# Patient Record
Sex: Male | Born: 1970 | Hispanic: No | Smoking: Never smoker
Health system: Southern US, Community
[De-identification: ages and names within clinical notes are randomized; demographics above are authoritative.]

---

## 1999-08-14 ENCOUNTER — Other Ambulatory Visit: Admission: RE | Admit: 1999-08-14 | Discharge: 1999-08-14 | Payer: Self-pay | Admitting: Otolaryngology

## 1999-08-14 ENCOUNTER — Encounter (INDEPENDENT_AMBULATORY_CARE_PROVIDER_SITE_OTHER): Payer: Self-pay | Admitting: Specialist

## 2006-12-10 ENCOUNTER — Ambulatory Visit: Payer: Self-pay | Admitting: Internal Medicine

## 2006-12-14 ENCOUNTER — Ambulatory Visit: Payer: Self-pay | Admitting: *Deleted

## 2007-03-08 ENCOUNTER — Ambulatory Visit: Payer: Self-pay | Admitting: Internal Medicine

## 2007-03-08 ENCOUNTER — Ambulatory Visit (HOSPITAL_COMMUNITY): Admission: RE | Admit: 2007-03-08 | Discharge: 2007-03-08 | Payer: Self-pay | Admitting: Internal Medicine

## 2007-09-23 ENCOUNTER — Ambulatory Visit: Payer: Self-pay | Admitting: Internal Medicine

## 2008-03-27 ENCOUNTER — Ambulatory Visit: Payer: Self-pay | Admitting: Internal Medicine

## 2008-04-11 ENCOUNTER — Ambulatory Visit: Payer: Self-pay | Admitting: Internal Medicine

## 2008-04-11 ENCOUNTER — Encounter (INDEPENDENT_AMBULATORY_CARE_PROVIDER_SITE_OTHER): Payer: Self-pay | Admitting: Adult Health

## 2008-04-11 LAB — CONVERTED CEMR LAB
ALT: 39 units/L (ref 0–53)
Albumin: 4.8 g/dL (ref 3.5–5.2)
Alkaline Phosphatase: 58 units/L (ref 39–117)
BUN: 12 mg/dL (ref 6–23)
Basophils Absolute: 0 10*3/uL (ref 0.0–0.1)
Hemoglobin: 14.8 g/dL (ref 13.0–17.0)
MCHC: 34 g/dL (ref 30.0–36.0)
MCV: 82.2 fL (ref 78.0–100.0)
Monocytes Absolute: 0.4 10*3/uL (ref 0.1–1.0)
PSA: 0.98 ng/mL (ref 0.10–4.00)
Potassium: 3.9 meq/L (ref 3.5–5.3)
RBC: 5.29 M/uL (ref 4.22–5.81)
RDW: 13.5 % (ref 11.5–15.5)
TSH: 3.096 microintl units/mL (ref 0.350–4.50)
Total Bilirubin: 1.1 mg/dL (ref 0.3–1.2)
Total CHOL/HDL Ratio: 6.4
VLDL: 64 mg/dL — ABNORMAL HIGH (ref 0–40)
WBC: 5.9 10*3/uL (ref 4.0–10.5)

## 2008-04-26 ENCOUNTER — Ambulatory Visit: Payer: Self-pay | Admitting: Internal Medicine

## 2009-01-13 ENCOUNTER — Inpatient Hospital Stay (HOSPITAL_COMMUNITY): Admission: EM | Admit: 2009-01-13 | Discharge: 2009-01-16 | Payer: Self-pay | Admitting: Emergency Medicine

## 2009-01-13 DIAGNOSIS — G459 Transient cerebral ischemic attack, unspecified: Secondary | ICD-10-CM | POA: Insufficient documentation

## 2009-01-15 ENCOUNTER — Encounter (INDEPENDENT_AMBULATORY_CARE_PROVIDER_SITE_OTHER): Payer: Self-pay | Admitting: Neurology

## 2009-01-15 ENCOUNTER — Ambulatory Visit: Payer: Self-pay | Admitting: Physical Medicine & Rehabilitation

## 2009-01-18 ENCOUNTER — Encounter: Admission: RE | Admit: 2009-01-18 | Discharge: 2009-01-19 | Payer: Self-pay | Admitting: Neurology

## 2009-03-05 ENCOUNTER — Ambulatory Visit: Payer: Self-pay | Admitting: Internal Medicine

## 2009-03-05 ENCOUNTER — Encounter (INDEPENDENT_AMBULATORY_CARE_PROVIDER_SITE_OTHER): Payer: Self-pay | Admitting: Adult Health

## 2009-03-05 LAB — CONVERTED CEMR LAB
AST: 34 units/L (ref 0–37)
Albumin: 4.4 g/dL (ref 3.5–5.2)
Alkaline Phosphatase: 60 units/L (ref 39–117)
BUN: 16 mg/dL (ref 6–23)
Calcium: 8.9 mg/dL (ref 8.4–10.5)
Chloride: 104 meq/L (ref 96–112)
Creatinine, Ser: 0.8 mg/dL (ref 0.40–1.50)
Glucose, Bld: 96 mg/dL (ref 70–99)
Potassium: 4.2 meq/L (ref 3.5–5.3)
Total Bilirubin: 0.8 mg/dL (ref 0.3–1.2)
Triglycerides: 220 mg/dL — ABNORMAL HIGH (ref <150)

## 2009-11-13 ENCOUNTER — Emergency Department (HOSPITAL_COMMUNITY): Admission: EM | Admit: 2009-11-13 | Discharge: 2009-11-13 | Payer: Self-pay | Admitting: Emergency Medicine

## 2010-09-29 LAB — BETA-2-GLYCOPROTEIN I ABS, IGG/M/A
Beta-2 Glyco I IgG: <10 U/mL (ref <20)
Beta-2-Glycoprotein I IgA: <10 U/mL (ref <20)
Beta-2-Glycoprotein I IgM: <10 U/mL (ref <20)

## 2010-09-29 LAB — DIFFERENTIAL
Basophils Absolute: 0 10*3/uL (ref 0.0–0.1)
Basophils Relative: 0 % (ref 0–1)
Basophils Relative: 0 % (ref 0–1)
Eosinophils Absolute: 0.1 10*3/uL (ref 0.0–0.7)
Lymphocytes Relative: 21 % (ref 12–46)
Lymphs Abs: 2.8 10*3/uL (ref 0.7–4.0)
Monocytes Relative: 12 % (ref 3–12)
Neutro Abs: 4.7 10*3/uL (ref 1.7–7.7)
Neutro Abs: 6.5 10*3/uL (ref 1.7–7.7)
Neutrophils Relative %: 55 % (ref 43–77)
Neutrophils Relative %: 70 % (ref 43–77)

## 2010-09-29 LAB — COMPREHENSIVE METABOLIC PANEL
ALT: 37 U/L (ref 0–53)
Alkaline Phosphatase: 59 U/L (ref 39–117)
Chloride: 99 mEq/L (ref 96–112)
GFR calc Af Amer: >60 mL/min (ref >60)
GFR calc non Af Amer: >60 mL/min (ref >60)
Sodium: 138 mEq/L (ref 135–145)
Total Protein: 8.3 g/dL (ref 6.0–8.3)

## 2010-09-29 LAB — CK TOTAL AND CKMB (NOT AT ARMC)
CK, MB: 4.8 ng/mL — ABNORMAL HIGH (ref 0.3–4.0)
CK, MB: 5.7 ng/mL — ABNORMAL HIGH (ref 0.3–4.0)
Relative Index: 2.3 (ref 0.0–2.5)
Total CK: 253 U/L — ABNORMAL HIGH (ref 7–232)

## 2010-09-29 LAB — BASIC METABOLIC PANEL
CO2: 27 mEq/L (ref 19–32)
Calcium: 9.1 mg/dL (ref 8.4–10.5)
Creatinine, Ser: 0.77 mg/dL (ref 0.4–1.5)
GFR calc non Af Amer: >60 mL/min (ref >60)
Glucose, Bld: 113 mg/dL — ABNORMAL HIGH (ref 70–99)
Sodium: 138 mEq/L (ref 135–145)

## 2010-09-29 LAB — URINALYSIS, MICROSCOPIC ONLY
Bilirubin Urine: NEGATIVE
Ketones, ur: NEGATIVE mg/dL
Nitrite: NEGATIVE
Specific Gravity, Urine: >1.046 — ABNORMAL HIGH (ref 1.005–1.030)
Urobilinogen, UA: 0.2 mg/dL (ref 0.0–1.0)
pH: 5.5 (ref 5.0–8.0)

## 2010-09-29 LAB — CARDIOLIPIN ANTIBODIES, IGG, IGM, IGA
Anticardiolipin IgA: <10 [APL'U] — ABNORMAL LOW (ref <12)
Anticardiolipin IgG: <10 [GPL'U] — ABNORMAL LOW (ref <15)
Anticardiolipin IgM: <10 [MPL'U] — ABNORMAL LOW (ref <13)

## 2010-09-29 LAB — CARDIAC PANEL(CRET KIN+CKTOT+MB+TROPI)
Total CK: 229 U/L (ref 7–232)
Troponin I: 0.02 ng/mL (ref 0.00–0.06)

## 2010-09-29 LAB — CBC
HCT: 45.2 % (ref 39.0–52.0)
Hemoglobin: 14.4 g/dL (ref 13.0–17.0)
MCHC: 34.5 g/dL (ref 30.0–36.0)
MCV: 83 fL (ref 78.0–100.0)
Platelets: 225 10*3/uL (ref 150–400)
RBC: 5.07 MIL/uL (ref 4.22–5.81)
RDW: 13.2 % (ref 11.5–15.5)
RDW: 13.7 % (ref 11.5–15.5)
WBC: 9.3 10*3/uL (ref 4.0–10.5)

## 2010-09-29 LAB — APTT: aPTT: 27 seconds (ref 24–37)

## 2010-09-29 LAB — LIPID PANEL
Cholesterol: 285 mg/dL — ABNORMAL HIGH (ref 0–200)
LDL Cholesterol: 196 mg/dL — ABNORMAL HIGH (ref 0–99)
Total CHOL/HDL Ratio: 7 RATIO
Triglycerides: 238 mg/dL — ABNORMAL HIGH (ref <150)
VLDL: 48 mg/dL — ABNORMAL HIGH (ref 0–40)

## 2010-09-29 LAB — ANTITHROMBIN III: AntiThromb III Func: 103 % (ref 76–126)

## 2010-09-29 LAB — ANA: Anti Nuclear Antibody(ANA): NEGATIVE

## 2010-09-29 LAB — GLUCOSE, CAPILLARY
Glucose-Capillary: 101 mg/dL — ABNORMAL HIGH (ref 70–99)
Glucose-Capillary: 113 mg/dL — ABNORMAL HIGH (ref 70–99)
Glucose-Capillary: 97 mg/dL (ref 70–99)

## 2010-09-29 LAB — TROPONIN I
Troponin I: 0.01 ng/mL (ref 0.00–0.06)
Troponin I: 0.02 ng/mL (ref 0.00–0.06)

## 2010-09-29 LAB — PROTEIN S, TOTAL: Protein S Ag, Total: 156 % — ABNORMAL HIGH (ref 70–140)

## 2010-09-29 LAB — HEMOGLOBIN A1C

## 2010-09-29 LAB — LUPUS ANTICOAGULANT PANEL
DRVVT: 62 secs — ABNORMAL HIGH (ref 36.1–47.0)
Lupus Anticoagulant: NOT DETECTED
PTT Lupus Anticoagulant: 62.4 secs — ABNORMAL HIGH (ref 36.3–48.8)

## 2010-09-29 LAB — PROTEIN C ACTIVITY: Protein C Activity: 55 % — ABNORMAL LOW (ref 75–133)

## 2010-09-29 LAB — HOMOCYSTEINE: Homocysteine: 9.5 umol/L (ref 4.0–15.4)

## 2010-09-29 LAB — PROTEIN S ACTIVITY: Protein S Activity: 127 % (ref 69–129)

## 2010-10-05 IMAGING — CR DG CHEST 1V PORT
1 series · 1 of 1 positions shown · non-contrast
Comparison: None.

CLINICAL DATA: 38-year-old male with weakness on the right.

PORTABLE CHEST - 1 VIEW

[view not recorded]
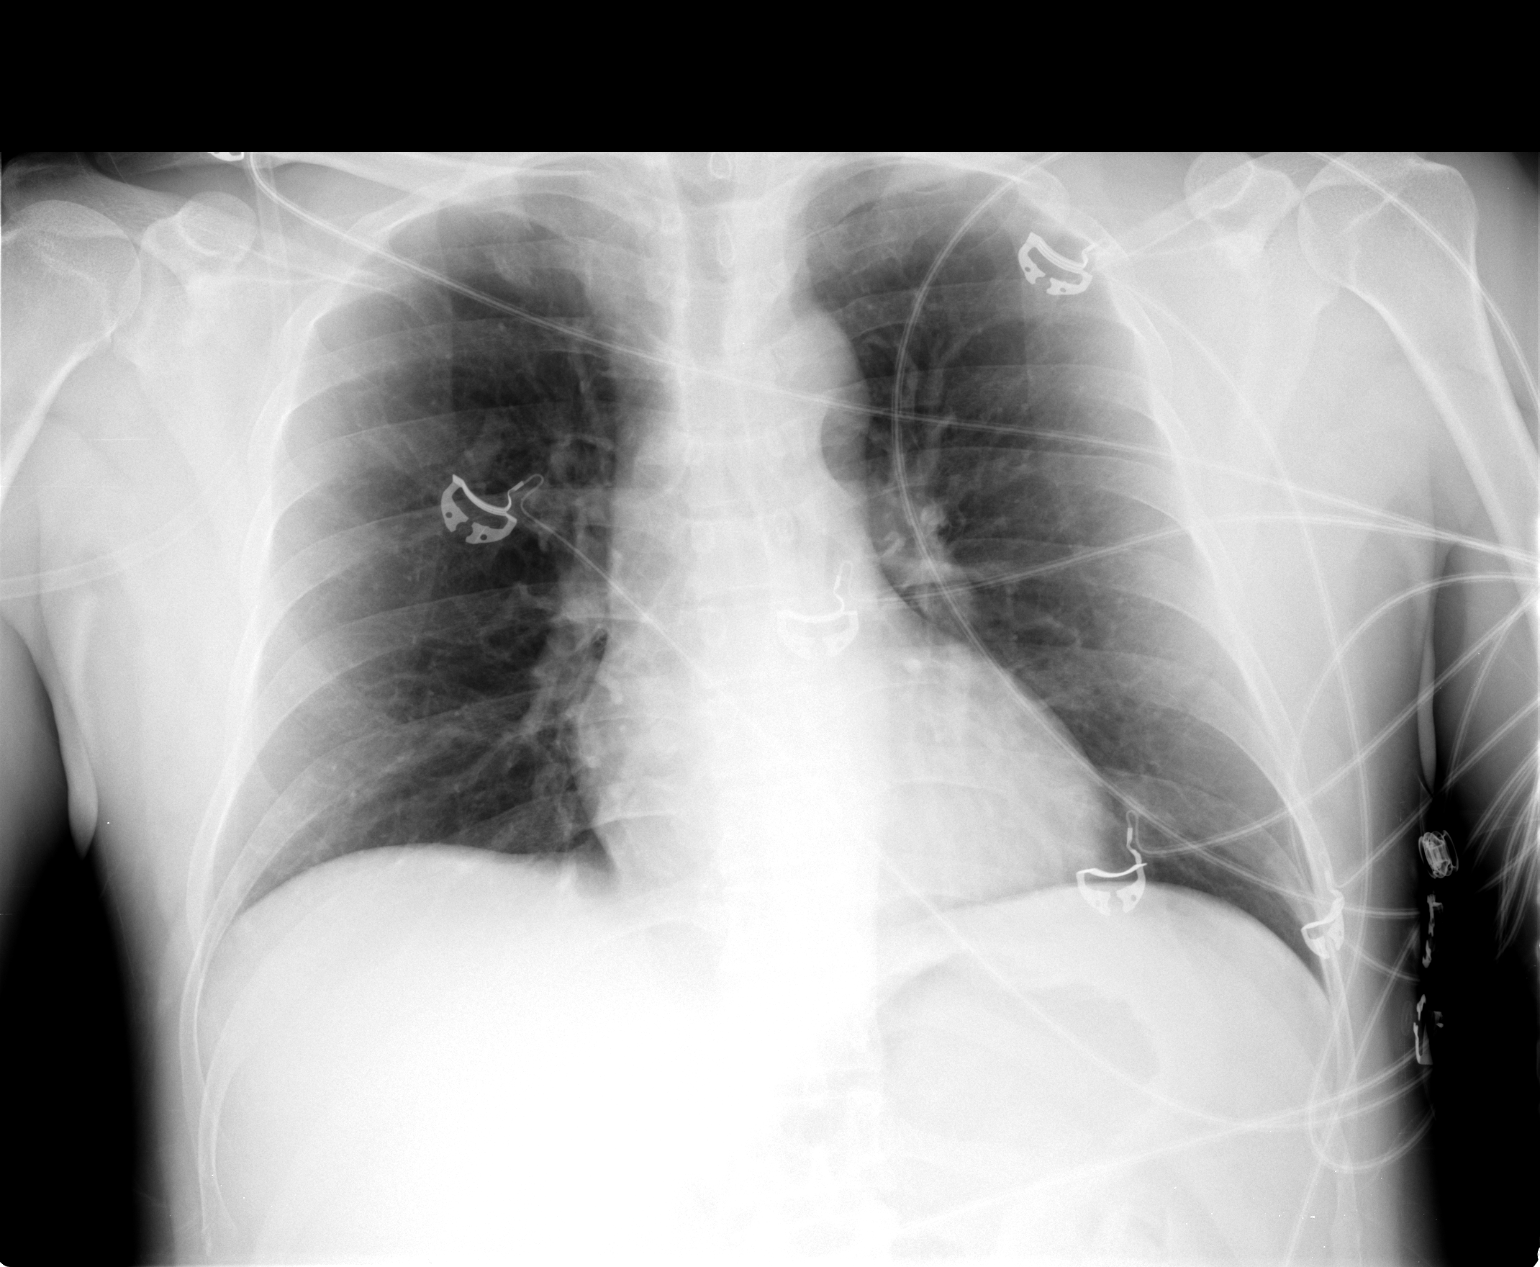

[1 of 1 positions shown; findings below may reference images not displayed]

FINDINGS: AP portable semi upright view 6199 hours.  Lung volumes
are within normal limits.  Cardiac size and mediastinal contours
are within normal limits.  Allowing for portable technique, the
lungs are clear.
IMPRESSION: No acute cardiopulmonary abnormality.

## 2010-11-05 NOTE — Discharge Summary (Signed)
Steven Leonard                   ACCOUNT NO.:  [id number]   MEDICAL RECORD NO.:  [id number]          PATIENT TYPE:  INP   LOCATION:  3029                         FACILITY:  [facility]   PHYSICIAN:  Pramod P. Pearlean Brownie, MD    DATE OF BIRTH:  1971/06/02   DATE OF ADMISSION:  01/13/2009  DATE OF DISCHARGE:  01/16/2009                               DISCHARGE SUMMARY   DIAGNOSES AT THE TIME OF DISCHARGE:  1. Left corona radiata infarct, status post full-dose IV t-PA.      Infarct secondary to small vessel disease.  2. Dyslipidemia.  3. Equivocal for hypertension.   MEDICINES AT THE TIME OF DISCHARGE:  1. Aspirin 325 mg a day.  2. Zocor 20 mg a day.   STUDIES PERFORMED:  1. CT of the brain on admission shows no acute abnormality.  2. Chest x-ray, no acute abnormality.  3. CT angio of the head and neck shows minor atherosclerosis without      cervical or vertebral artery stenosis of the neck.  No acute      findings of the neck.  4. 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  No focal branch      occlusion.  Bilateral cavernous ICA, calcified atherosclerosis with      no high-grade intracranial atherosclerosis.  5. MRI of the brain shows acute infarct in the posterior basal      ganglia, external capsule, and corona radiata on the left.  No      hemorrhage or mass effect.  Followup CT 24 hours after t-PA shows a      left basal ganglia lacunar infarct.  No hemorrhage or other      abnormalities.  6. A 2-D echocardiogram shows EF of 65-70% with no obvious source of      embolus.  7. Carotid Doppler not performed.  8. EKG shows sinus rhythm with ST elevation, consider posterior      infarct, abnormal T wave inferior leads.   LABORATORY STUDIES:  Chemistry with glucose 113, otherwise normal.  Hemoglobin A1c unable to calculate due to abnormal hemoglobin.  Homocysteine 9.5.  Urinalysis with small leukocyte esterase, trace  of  blood, and high specific gravity of 1.046, 3-6 white blood cells, and 7-  10 red blood cells.  Cardiac enzymes were negative.  Cholesterol was  285, triglycerides 238, HDL 41, and LDL 196.  CBC was normal.  CBC with  differential was normal.  Liver functions are normal and hypercoagulable  panel has been drawn, results are pending.  ANA, sed rate, C3, C4, and  CH50 all pending.   HISTORY OF PRESENT ILLNESS:  Steven Leonard is a 40 year old right-handed  Asian male who developed acute onset of numbness and weakness involving  his right arm and leg as well as some difficulty with speech at 9:15  p.m. at the evening of admission.  His symptoms cleared fairly rapidly  after arriving in the emergency room.  CT of the head  showed no acute  abnormality.  A 20-30 minutes after the CT study in the emergency room,  the patient began to have speech difficulty again as well as numbness  and weakness involving the right arm and leg.  Laboratory studies were  unremarkable including clotting times.  The patient was deemed eligible  for t-PA.  His blood pressure was 161/103.  He was administered two-  third dose IV t-PA, received a CT angiogram, which showed no large  vessel occlusion, then received the other third t-PA.  He was admitted  to the Neuro ICU for further stroke evaluation needs.   HOSPITAL COURSE:  The patient had some improvement in hemiparesis  following t-PA.  CT 24 hours after t-PA showed no hemorrhage and the  patient was started on aspirin for secondary stroke prevention.  He was  found to have vascular risk factors of significant hypercholesterolemia  with LDL of 196.  The patient was started on Zocor 20 mg a day and that  needs to be followed up by primary after discharge.  He was evaluated by  PT, OT and speech therapy, and felt he would benefit from outpatient PT  and OT.  Arrangements were put in place.  Of note, CBGs were checked  during hospitalization, which were  relatively normal with only one at  175, the rest less than 125.  Hemoglobin A1c was unable to be calculated  due to presence of some abnormal hemoglobin.  Recommendations are to  follow up his hemoglobin A1c in the future.   CONDITION AT DISCHARGE:  The patient is alert and oriented x3.  No  aphasia.  No dysarthria.  Eye movements are full.  Face is symmetric.  He has right grip weakness.  He has decreased fine motor movement on the  right and he has some decreased right foot dorsiflexion 4/5.   DISCHARGE PLAN:  1. Discharge home with family.  2. Aspirin for secondary stroke prevention.  3. Outpatient PT and OT at [facility] on [address].  4. New Zocor, need to follow up in 4-6 weeks.  5. Follow up with primary care at [facility] in 4-6 weeks.  6. Follow up with Delia Heady in 2 months.  7. Follow up hypercoagulable panel drawn at discharge.      Annie Main, N.P.    ______________________________  Sunny Schlein. Pearlean Brownie, MD    SB/MEDQ  D:  01/16/2009  T:  01/17/2009  Job:  [id number]   cc:   Pramod P. Pearlean Brownie, MD  [facility]  Outpatient Rehab

## 2010-11-05 NOTE — H&P (Signed)
NAMEBRENTYN, SEEHAFER                   ACCOUNT NO.:  [id number]   MEDICAL RECORD NO.:  [id number]          PATIENT TYPE:  INP   LOCATION:  3103                         FACILITY:  [facility]   PHYSICIAN:  Noel Christmas, MD    DATE OF BIRTH:  [birth year]   DATE OF ADMISSION:  01/13/2009  DATE OF DISCHARGE:                              HISTORY & PHYSICAL   CHIEF COMPLAINT:  Acute onset of right face, arm, and leg weakness along  with speech difficulty.   HISTORY OF PRESENT ILLNESS:  This is a 40 year old man who was in his  usual state of good health until about 9:15 this evening when he  developed acute onset of numbness and weakness involving his right arm  and leg, as well as some difficulty with speech.  He noted slight  numbness of his face as well.  His symptoms cleared fairly rapidly after  arriving in the emergency room.  CT scan of his head showed no acute  intracranial abnormality.  About 20-30 minutes after CT study, the  patient began to have speech difficulty again, as well as numbness and  weakness involving right arm and leg.   Laboratory studies were unremarkable including clotting times.  The  patient was deemed eligible for TPA.  He has no previous history of  stroke or TIA.  He has not been on antiplatelet therapy.  His blood  pressure was elevated on arriving in the emergency room (161/103).   PAST MEDICAL HISTORY:  Equivocal for hypertension.  He was treated at  one point.  However, following a series of normal blood pressure  determinations, medications were stopped.   CURRENT MEDICATIONS:  None.   ALLERGIES:  No known drug allergies.   FAMILY HISTORY:  Positive for stroke involving his father.  Otherwise  family history is noncontributory.   SOCIAL HISTORY:  The patient is married, lives with wife and children.  He does not use tobacco products.  He occasionally drinks beer.  There  is no history of illicit drug use.   REVIEW OF SYSTEMS:  NEUROLOGIC:  As above.   CARDIOVASCULAR:  Negative.  PULMONARY:  Negative.  GASTROINTESTINAL:  Negative.  GENITOURINARY:  Negative.  MUSCULOSKELETAL:  Occasional muscle cramps.  ENDOCRINE:  Negative.  REPRODUCTIVE:  Negative.  HEMATOLOGIC/LYMPHATIC:  Negative.  IMMUNOLOGIC:  Negative.  PSYCHIATRIC:  Negative.   PHYSICAL EXAMINATION:  VITAL SIGNS:  Temperature was 97.6, pulse was 72  per minute, respirations 20 per minute, blood pressure 161/103, oxygen  saturation was 99% on 2 L of oxygen per minute.  GENERAL APPEARANCE:  A middle-aged man, medium built who is alert,  cooperative, and in no acute distress.  His mental status was normal.  He had a slight expressive aphasia.  HEENT:  Normal.  NECK:  Supple with no tenderness.  CHEST:  Clear to auscultation.  HEART:  Rate and rhythm were normal.  Heart sounds were normal.  ABDOMEN:  Soft and flat with normal bowel sounds and no tenderness.  SKIN and MUCOSA:  Normal.  EXTREMITIES:  Normal.  GENITALIA and RECTUM:  Deferred.  NEUROLOGIC:  Pupils were equal and reactive normally to light.  Extraocular movements were full and conjugate.  Visual fields were  intact and normal.  He had mild facial numbness, as well as mild right  lower facial weakness.  Hearing was normal.  Speech was slightly slurred  with mild expressive aphasia.  MOTOR:  Pronator drift of his right upper extremity, as well as mild  proximal weakness of the right upper and right lower extremities.  Strength of his left extremities was normal.  Deep tendon reflexes were  normal and symmetrical.  Plantar responses were flexor bilaterally.  SENSORY:  Reduced tactile sensory perception over the right arm and leg  compared to the left extremity.  Carotid auscultation was normal.   LABORATORY DATA:  WBC count was 8.6, hemoglobin 15.6, hematocrit 45.2,  and platelets 225,000.  Prothrombin time was 12.4, INR was 0.9, and PTT  was 27.  Serum sodium was 138, potassium 3.1, chloride 99, CO2 of 30,  BUN 15,  creatinine 1.02, glucose 93, and calcium 10.0.  Total CK was  253, CK-MB 5.7, and troponin 0.1.  EKG showed nonspecific T-wave changes  with no actual acute ischemic changes.  CT scan showed no acute changes.  CT angiogram of the head and neck show no signs of thrombus nor signs of  significant stenosis of left internal carotid and middle cerebral  arteries.   CLINICAL IMPRESSION:  1. Recurrent ischemic events, most likely transient ischemic attacks      involving left middle cerebral artery territory.  Acute stroke      cannot be ruled out at this point.  2. No signs of acute significant stenosis nor thrombosis involving      left internal carotid artery and left middle cerebral artery.  3. Significance of mild elevation and serum CK level and CK-MB with      normal troponin is unclear.   PLAN:  1. Status post t-PA protocol.  2. Repeat CT of the head in 24 hours.  3. Rehab if deficits persist.  4. MRA of the brain without contrast.  5. Echocardiogram.  6. Cardiology consult if enzymes trend upward.      Noel Christmas, MD  Electronically Signed     CS/MEDQ  D:  01/14/2009  T:  01/14/2009  Job:  [id number]

## 2011-08-05 IMAGING — CR DG SHOULDER 2+V*R*
3 series · 3 of 3 positions shown · non-contrast
Comparison: None.

CLINICAL DATA: 39-year-old male status post fall with pain.

RIGHT SHOULDER - 2+ VIEW

[w shoulder ap internal righ]
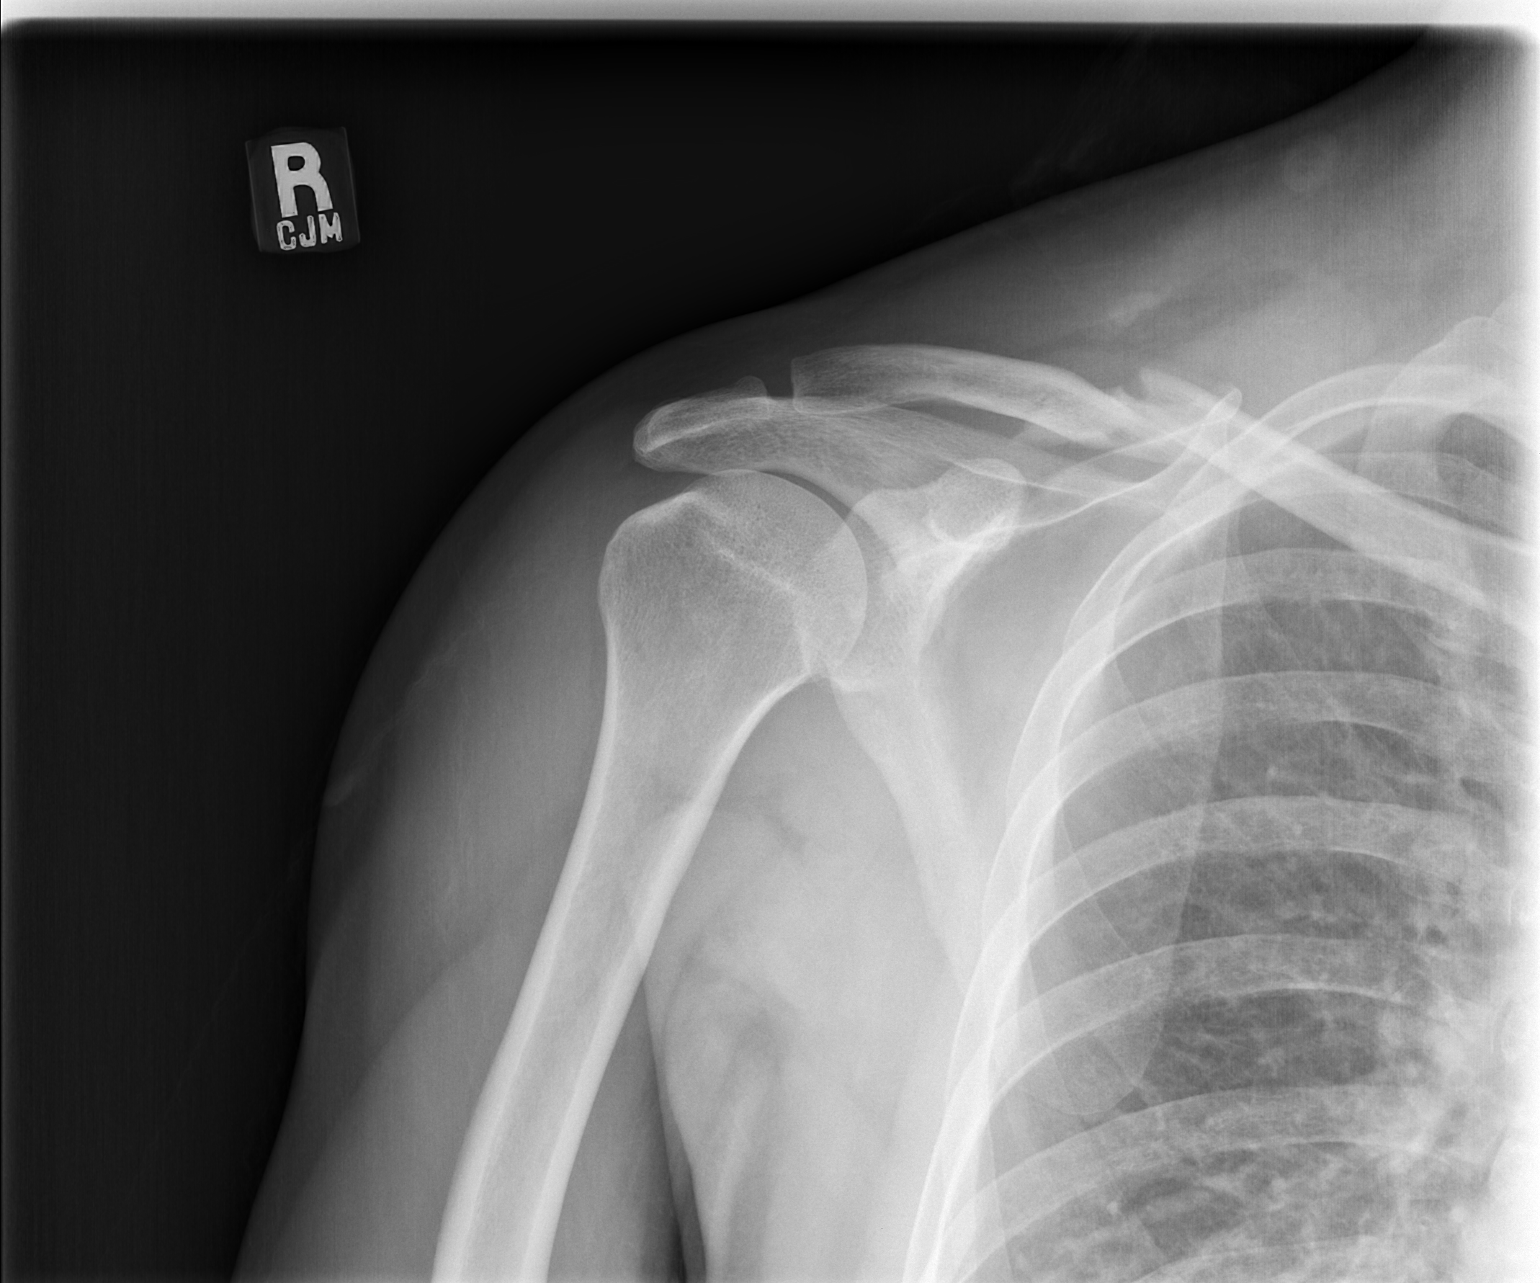

[w shoulder ap external righ]
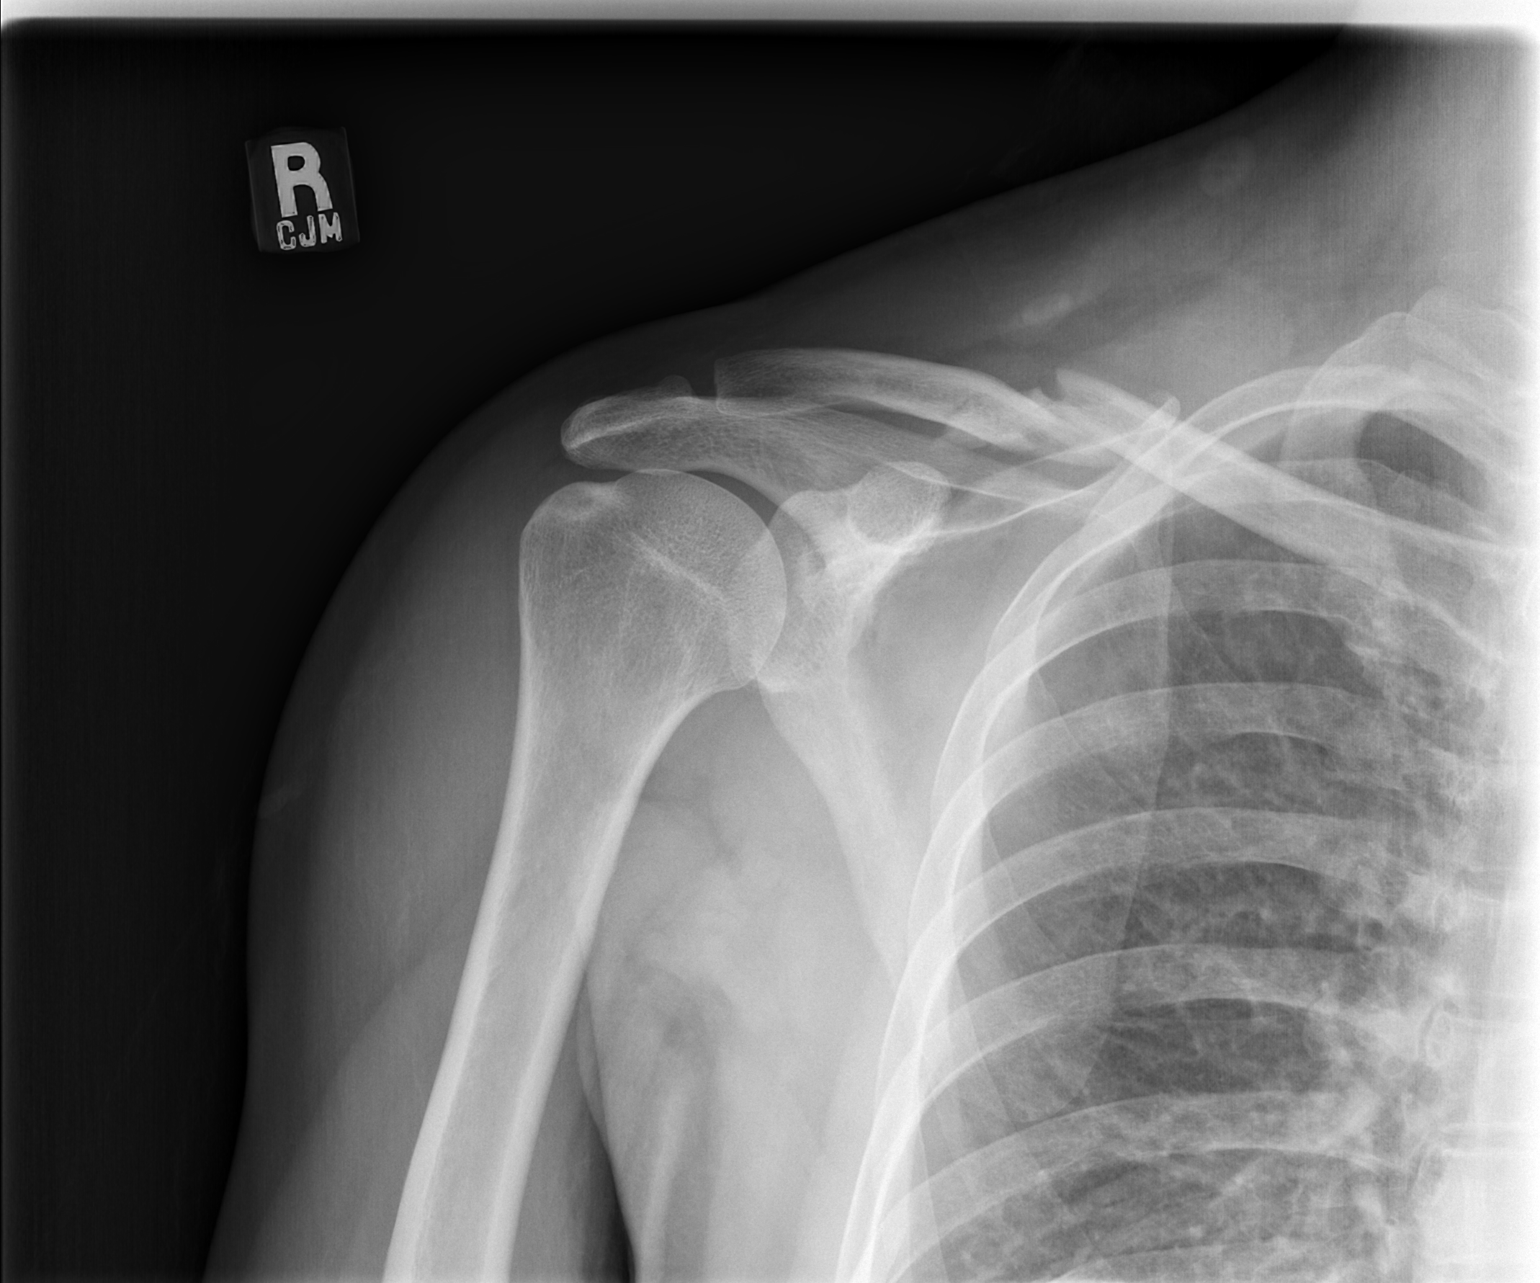

[w shoulder y view right]
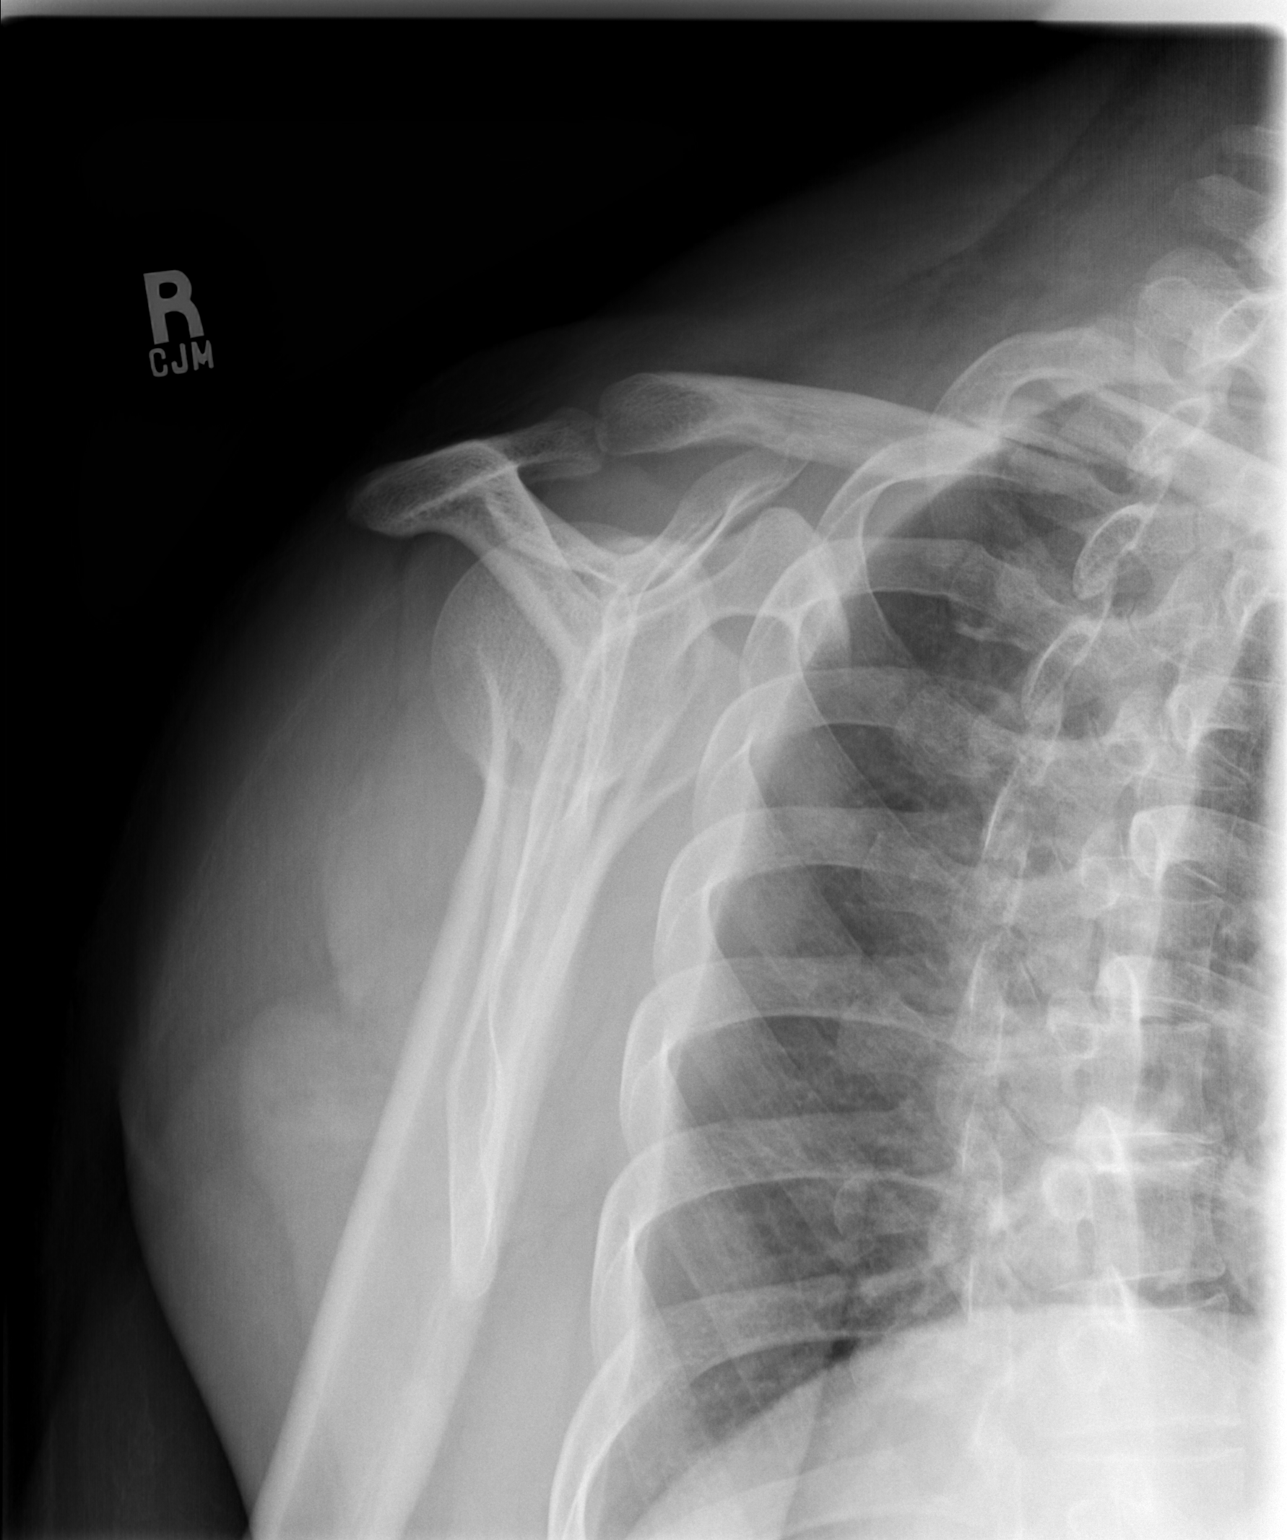

[3 of 3 positions shown; findings below may reference images not displayed]

FINDINGS: Comminuted mid shaft right clavicle fracture with one
full shaft width of inferior displacement of the distal fragment.
Fragment or overriding by the up to 2 cm. No glenohumeral joint
dislocation.   Scapula, proximal humerus, and visualized right ribs
appear intact.
IMPRESSION: Comminuted mid shaft right clavicle fracture as detailed above.

## 2017-11-17 ENCOUNTER — Ambulatory Visit: Payer: BLUE CROSS/BLUE SHIELD | Admitting: Physician Assistant

## 2017-11-17 ENCOUNTER — Encounter: Payer: Self-pay | Admitting: Physician Assistant

## 2017-11-17 VITALS — BP 122/72 | HR 71 | Temp 97.9°F | Resp 17 | Ht 63.5 in | Wt 146.0 lb

## 2017-11-17 DIAGNOSIS — Z0000 Encounter for general adult medical examination without abnormal findings: Secondary | ICD-10-CM | POA: Diagnosis not present

## 2017-11-17 DIAGNOSIS — Z1329 Encounter for screening for other suspected endocrine disorder: Secondary | ICD-10-CM | POA: Diagnosis not present

## 2017-11-17 DIAGNOSIS — Z130 Encounter for screening for diseases of the blood and blood-forming organs and certain disorders involving the immune mechanism: Secondary | ICD-10-CM

## 2017-11-17 DIAGNOSIS — Z1321 Encounter for screening for nutritional disorder: Secondary | ICD-10-CM | POA: Diagnosis not present

## 2017-11-17 DIAGNOSIS — Z13228 Encounter for screening for other metabolic disorders: Secondary | ICD-10-CM

## 2017-11-17 LAB — POCT URINALYSIS DIP (MANUAL ENTRY)
Bilirubin, UA: NEGATIVE
Blood, UA: NEGATIVE
Glucose, UA: NEGATIVE mg/dL
Ketones, POC UA: NEGATIVE mg/dL
Nitrite, UA: NEGATIVE
Protein Ur, POC: NEGATIVE mg/dL
Spec Grav, UA: 1.02 (ref 1.010–1.025)
Urobilinogen, UA: 0.2 U/dL
pH, UA: 5.5 (ref 5.0–8.0)

## 2017-11-17 NOTE — Progress Notes (Signed)
Primary Care at [place], [place] [address] [phone]  Date:  11/17/2017   Name:  Steven Leonard   DOB:  1970-07-08   MRN:  [id number]  PCP:  Patient, No Pcp Per    Chief Complaint: Annual Exam   History of Present Illness:  This is a 47 y.o. male with PMH TIA who is presenting for CPE.  He is from [place]. 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    Former PCP was [facility]. This is too far for him to travel now.   Complaints:  none Immunizations: needs tdap Dentist: does not get check-ups. Brushes bid and flosses regularly. No dental pain.  Eye: 20/20 b/l Diet: cooks mostly at home.  Exercise: none Fam hx: family history includes Cancer (age of onset: 67) in his brother; Cancer (age of onset: 51) in his mother; Stroke (age of onset: 18) in his father.  Sexual hx: girlfriend. Intercourse with one partner only. Asymtpomatic.  Urinary hesitancy/frequency or nocturia: 1-2 times/night Problems with erectile dysfunction: none Tobacco/alcohol/substance use: social drinker on weekend, never smoker, no drug use.    Review of Systems:  Review of Systems  Constitutional: Negative for activity change, appetite change and fatigue.  HENT: Negative for congestion, dental problem, sneezing and tinnitus.   Eyes: Negative for visual disturbance.  Respiratory: Negative for cough, chest tightness, shortness of breath and wheezing.   Cardiovascular: Negative for chest pain, palpitations and leg swelling.  Gastrointestinal: Negative for abdominal pain, blood in stool, constipation, diarrhea, nausea and vomiting.  Endocrine: Negative for polydipsia, polyphagia and polyuria.  Genitourinary: Negative for decreased urine volume, difficulty urinating, discharge, hematuria, scrotal swelling and testicular pain.  Musculoskeletal: Negative for arthralgias, back pain and neck stiffness.  Allergic/Immunologic: Negative for environmental allergies and food allergies.  Neurological:  Negative for dizziness, syncope, weakness, light-headedness and headaches.  Psychiatric/Behavioral: Negative for sleep disturbance. The patient is not nervous/anxious.     There are no active problems to display for this patient.   Prior to Admission medications   Not on File    Not on File    Social History   Tobacco Use  . Smoking status: Never Smoker  . Smokeless tobacco: Never Used  Substance Use Topics  . Alcohol use: Not on file  . Drug use: Not on file    No family history on file.  Medication list has been reviewed and updated.  Physical Examination:  Physical Exam  Constitutional: He is oriented to person, place, and time. He appears well-developed and well-nourished.  HENT:  Mouth/Throat: Oropharynx is clear and moist and mucous membranes are normal. Dental caries present. No dental abscesses.  Plaque on teeth.  Cardiovascular: Normal rate and regular rhythm.  Pulmonary/Chest: Effort normal. No respiratory distress.  Neurological: He is alert and oriented to person, place, and time.  Skin: Skin is warm and dry.  Psychiatric: He has a normal mood and affect. His behavior is normal. Judgment and thought content normal.  Vitals reviewed.   BP 122/72   Pulse 71   Temp 97.9 F (36.6 C) (Oral)   Resp 17   Ht 5' 3.5" (1.613 m)   Wt 146 lb (66.2 kg)   SpO2 98%   BMI 25.46 kg/m  Results for orders placed or performed in visit on 11/17/17  POCT urinalysis dipstick  Result Value Ref Range   Color, UA yellow yellow   Clarity, UA clear clear   Glucose, UA negative negative mg/dL  Bilirubin, UA negative negative   Ketones, POC UA negative negative mg/dL   Spec Grav, UA 1.020 1.010 - 1.025   Blood, UA negative negative   pH, UA 5.5 5.0 - 8.0   Protein Ur, POC negative negative mg/dL   Urobilinogen, UA 0.2 0.2 or 1.0 E.U./dL   Nitrite, UA Negative Negative   Leukocytes, UA Trace (A) Negative    Assessment and Plan: 1. Annual physical exam -Patient  presents for annual exam.  He is transferring care here because his primary care provider is too far away.  Routine labs are pending -will contact with results.  Discussed with patient need for colonoscopy next year.  Information printed out. Encouraged pt to see dentist for routine cleaning.  2. Screening for endocrine, nutritional, metabolic and immunity disorder - Lipid panel - CBC with Differential/Platelet - CMP14+EGFR - POCT urinalysis dipstick   Mercer Pod, PA-C  [facility] at [place] 11/17/2017 2:44 PM

## 2017-11-17 NOTE — Patient Instructions (Addendum)
You will need a colonoscopy next year. Please see below for details.  Make an appointment for routine cleaning of your teeth with a dentist.     N?i soi ??i trng, Ng??i l?n Colonoscopy, Adult N?i soi ??i trng l th?m khm ?? ki?m tra ton b? ru?t gi. Trong qu trnh th?m khm, m?t ?ng ???c bi tr?n, c th? u?n [place] ???c ??a vo trong h?u mn v sau ? vo tr?c trng, ??i trng v cc ph?n khc c?a ru?t gi. N?i soi ??i trng th??ng ???c th?c hi?n nh? m?t ph?n c?a khm sng l?c ??i-tr?c k?t trng thng th??ng ho?c ?? ?ng ph v?i m?t s? tri?u ch?ng nh?t ??nh, ch?ng h?n nh? b?nh thi?u mu, tiu ch?y dai d?ng, ?au b?ng v mu trong phn. Th?m khm ny c th? gip sng l?c v ch?n ?on cc v?n ?? b?nh l, bao g?m:  Kh?i u.  Polip.  Vim.  Cc vng ch?y mu.  Hy cho chuyn gia ch?m [place] s?c kh?e bi?t v?:  B?t k? v?n ?? d? ?ng no m qu v? c.  T?t c? cc lo?i thu?c m qu v? ?ang s? d?ng, bao g?m c? vitamin, th?o d??c, thu?c nh? m?t, thu?c d?ng kem, thu?c khng k ??n.  B?t k? v?n ?? g m qu v? ho?c cc thnh vin trong gia ?nh ? g?p ph?i v?i thu?c gy m.  B?t k? r?i lo?n v? mu no m qu v? c.  B?t k? ph?u thu?t no qu v? ? ???c lm.  B?t k? tnh tr?ng b?nh l no c?a qu v?.  B?t k? v?n ?? no m qu v? ? b? trong khi ??i ti?n. Cc nguy c? l g? Ni chung, ?y l m?t th? thu?t an ton. Tuy nhin, cc v?n ?? c th? x?y ra, bao g?m:  Ch?y mu.  V?t rch trong ru?t.  Ph?n ?ng v?i thu?c ???c cho s? d?ng trong lc th?m khm.  Nhi?m trng (hi?m g?p).  ?i?u g x?y ra tr??c khi lm th? thu?t? Nh?ng h?n ch? v? ?n v u?ng Tun th? ch? d?n c?a chuyn gia ch?m [place] s?c kh?e v? ?n v u?ng, c th? bao g?m:  M?t vi ngy tr??c khi ti?n hnh th? thu?t - tun theo ch? ?? ?n t ch?t x?. Trnh ?n qu? h?ch, cc lo?i h?t, tri cy s?y, tri cy s?ng v rau.  1-3 ngy tr??c ngy ti?n hnh th? thu?t - tun theo ch? ?? ?n ?? l?ng trong. Ch? u?ng cc ?? l?ng trong, ch?ng h?n nh?  canh ho?c n??c canh th?t trong, tr ho?c c ph ?en, n??c p trong, n??c ng?t ho?c n??c u?ng th? thao trong, mn trng mi?ng ch?a gelatin v kem que. Trnh u?ng cc ch?t l?ng c ch?a ph?m mu ?? ho?c tm.  Vo ngy ti?n hnh th? thu?t - khng ?n hay u?ng b?t k? th? g trong vng 2 gi? tr??c khi ti?n hnh th? thu?t, ho?c trong kho?ng th?i gian m chuyn gia ch?m [place] s?c kh?e c?a qu v? ch? d?n.  Lm s?ch ru?t N?u qu v? ? ???c k ??n m?t lo?i thu?c x? qua ???ng u?ng ?? lm s?ch ??i trng:  Hy s?? du?ng theo ch? d?n c?a chuyn gia ch?m  s?c kh?e c?a qu v?. B?t ??u vo ngy tr??c khi lm th? thu?t, qu v? s? c?n u?ng m?t l??ng l?n d?ch l?ng pha thu?c. Ch?t l?ng ny s? lm qu v? ??i ti?n phn l?ng nhi?u l?n cho ??n khi phn g?n nh?  trong ho?c c mu xanh l cy nh?t.  N?u da ho?c h?u mn c?a qu v? b? kch ?ng do tiu ch?y, qu v? c th? s? d?ng nh?ng th? sau ?? lm gi?m kch ?ng: ? Kh?n lau t?m thu?c, ch?ng h?n nh? kh?n lau ??t c?a ng??i l?n c tinh ch?t l h?i v vitamin E. ? S?n ph?m lm d?u da nh? [place].  N?u qu v? b? nn trong khi u?ng thu?c x?, hy ngh? ng?i trong t?i ?a 60 pht v sau ? b?t ??u l?i vi?c lm s?ch ru?t. N?u qu v? ti?p t?c nn v khng th? u?ng thu?c x? m khng b? nn, hy g?i cho chuyn gia ch?m [place] s?c kh?e c?a qu v?.  H??ng d?n chung  Hy h?i chuyn gia ch?m [place] s?c kh?e v? vi?c thay ??i ho?c d?ng cc lo?i thu?c dng th??ng xuyn c?a qu v?. ?i?u ny ??c bi?t quan tr?ng n?u qu v? ?ang dng thu?c tr? ti?u ???ng ho?c thu?c lm long mu.  C k? ho?ch nh? ai ? ??a quy? vi? t? b?nh vi?n ho?c t? phng khm v? nh. ?i?u g x?y ra trong qu trnh th?c hi?n th? thu?t?  Qu v? c th? ???c ??t m?t ???ng truy?n t?nh m?ch (IV) vo m?t trong cc t?nh m?ch.  Qu v? s? ???c cho dng thu?c ?? gip th? gin (thu?c an th?n).  ?? gi?m nguy c? nhi?m trng: ? ??i ng? nhn vin y t? s? r?a ho?c st trng tay c?a h?. ? Vng h?u mn c?a qu v? s? ???c r?a b?ng x  phng.  Qu v? s? ???c yu c?u n?m nghing, hai ??u g?i g?p l?i.  Chuyn gia ch?m [place] s?c kh?e c?a qu v? s? bi tr?n m?t ?ng di, m?ng, m?m. ?ng s? ???c g?n camera v ?n ? ??u.  ?ng s? ???c ??a vo h?u mn c?a qu v?.  ?ng s? ???c nh? nhng ??a qua tr?c trng v ??i trng c?a qu v?.  Khng kh s? ???c b?m vo ??i trng c?a qu v? ?? gi? cho ??i trng m? r?ng. Qu v? c th? c?m th?y m?t cht p l?c ho?c co th?t.  Camera s? ???c s? d?ng ?? ch?p ?nh trong qu trnh ti?n hnh th? thu?t.  M?t m?u m nh? co? th? ????c l?y t?? c? th? c?a qu v? ?? ki?m tra d???i ki?nh hi?n vi (sinh thi?t). N?u pht hi?n th?y b?t k? v?n ?? ti?m tng no, m ny s? ???c g?i ??n phng th nghi?m ?? xt nghi?m.  N?u pht hi?n th?y cc polip nh?, chuyn gia ch?m [place] s?c kh?e c?a qu v? c th? l?y cc polip ? v mang ?i ki?m tra ?? xem c t? bo ung th? khng.  ?ng ??a vo h?u mn c?a qu v? s? ???c t? t? rt ra. Th? thu?t ny c th? khc nhau gi?a cc chuyn gia ch?m [place] s?c kh?e v cc b?nh vi?n. ?i?u g x?y ra sau khi lm th? thu?t?  Huy?t p, nh?p tim, nh?p th? v n?ng ?? oxi trong mu c?a qu v? s? ???c theo di cho ??n khi thu?c qu v? ? dng h?t tc d?ng.  Khng li xe trong vng 24 gi? sau khi th?m khm.  Qu v? c th? c m?t l??ng mu nh? trong phn.  Qu v? c th? trung ti?n v b? co th?t ho?c ch??ng b?ng nh? do khng kh ? ???c s? d?ng ?? lm ph?ng ??i trng c?a qu v? trong  lc th?m khm.  Qu v? ???c ty  l?y k?t qu? th? thu?t c?a mnh. Hy h?i chuyn gia ch?m [place] s?c kh?e ho?c khoa th?c hi?n thu? thu?t ?? bi?t khi no c k?t qu? c?a qu v?. Thng tin ny khng nh?m m?c ?ch thay th? cho l?i khuyn m chuyn gia ch?m [place] s?c kh?e ni v?i qu v?. Hy b?o ??m qu v? ph?i th?o lu?n b?t k? v?n ?? g m qu v? c v?i chuyn gia ch?m [place] s?c kh?e c?a qu v?. Document Released: 03/19/2005 Document Revised: 05/22/2016 Document Reviewed: 08/21/2015 Elsevier Interactive Patient Education  2018  [place] Maintenance, Male A healthy lifestyle and preventive care is important for your health and wellness. Ask your health care provider about what schedule of regular examinations is right for you. What should I know about weight and diet? Eat a Healthy Diet  Eat plenty of vegetables, fruits, whole grains, low-fat dairy products, and lean protein.  Do not eat a lot of foods high in solid fats, added sugars, or salt.  Maintain a Healthy Weight Regular exercise can help you achieve or maintain a healthy weight. You should:  Do at least 150 minutes of exercise each week. The exercise should increase your heart rate and make you sweat (moderate-intensity exercise).  Do strength-training exercises at least twice a week.  Watch Your Levels of Cholesterol and Blood Lipids  Have your blood tested for lipids and cholesterol every 5 years starting at 49 years of age. If you are at high risk for heart disease, you should start having your blood tested when you are 49 years old. You may need to have your cholesterol levels checked more often if: ? Your lipid or cholesterol levels are high. ? You are older than 49 years of age. ? You are at high risk for heart disease.  What should I know about cancer screening? Many types of cancers can be detected early and may often be prevented. Lung Cancer  You should be screened every year for lung cancer if: ? You are a current smoker who has smoked for at least 30 years. ? You are a former smoker who has quit within the past 15 years.  Talk to your health care provider about your screening options, when you should start screening, and how often you should be screened.  Colorectal Cancer  Routine colorectal cancer screening usually begins at 49 years of age and should be repeated every 5-10 years until you are 49 years old. You may need to be screened more often if early forms of precancerous polyps or small growths are found. Your  health care provider may recommend screening at an earlier age if you have risk factors for colon cancer.  Your health care provider may recommend using home test kits to check for hidden blood in the stool.  A small camera at the end of a tube can be used to examine your colon (sigmoidoscopy or colonoscopy). This checks for the earliest forms of colorectal cancer.  Prostate and Testicular Cancer  Depending on your age and overall health, your health care provider may do certain tests to screen for prostate and testicular cancer.  Talk to your health care provider about any symptoms or concerns you have about testicular or prostate cancer.  Skin Cancer  Check your skin from head to toe regularly.  Tell your health care provider about any new moles or changes in moles, especially if: ? There is a change  in a mole's size, shape, or color. ? You have a mole that is larger than a pencil eraser.  Always use sunscreen. Apply sunscreen liberally and repeat throughout the day.  Protect yourself by wearing long sleeves, pants, a wide-brimmed hat, and sunglasses when outside.  What should I know about heart disease, diabetes, and high blood pressure?  If you are 5-52 years of age, have your blood pressure checked every 3-5 years. If you are 54 years of age or older, have your blood pressure checked every year. You should have your blood pressure measured twice-once when you are at a hospital or clinic, and once when you are not at a hospital or clinic. Record the average of the two measurements. To check your blood pressure when you are not at a hospital or clinic, you can use: ? An automated blood pressure machine at a pharmacy. ? A home blood pressure monitor.  Talk to your health care provider about your target blood pressure.  If you are between 33-28 years old, ask your health care provider if you should take aspirin to prevent heart disease.  Have regular diabetes screenings by  checking your fasting blood sugar level. ? If you are at a normal weight and have a low risk for diabetes, have this test once every three years after the age of 52. ? If you are overweight and have a high risk for diabetes, consider being tested at a younger age or more often.  A one-time screening for abdominal aortic aneurysm (AAA) by ultrasound is recommended for men aged 39-75 years who are current or former smokers. What should I know about preventing infection? Hepatitis B If you have a higher risk for hepatitis B, you should be screened for this virus. Talk with your health care provider to find out if you are at risk for hepatitis B infection. Hepatitis C Blood testing is recommended for:  Everyone born from [birth year] through 1967.  Anyone with known risk factors for hepatitis C.  Sexually Transmitted Diseases (STDs)  You should be screened each year for STDs including gonorrhea and chlamydia if: ? You are sexually active and are younger than 49 years of age. ? You are older than 49 years of age and your health care provider tells you that you are at risk for this type of infection. ? Your sexual activity has changed since you were last screened and you are at an increased risk for chlamydia or gonorrhea. Ask your health care provider if you are at risk.  Talk with your health care provider about whether you are at high risk of being infected with HIV. Your health care provider may recommend a prescription medicine to help prevent HIV infection.  What else can I do?  Schedule regular health, dental, and eye exams.  Stay current with your vaccines (immunizations).  Do not use any tobacco products, such as cigarettes, chewing tobacco, and e-cigarettes. If you need help quitting, ask your health care provider.  Limit alcohol intake to no more than 2 drinks per day. One drink equals 12 ounces of beer, 5 ounces of wine, or 1 ounces of hard liquor.  Do not use street drugs.  Do not  share needles.  Ask your health care provider for help if you need support or information about quitting drugs.  Tell your health care provider if you often feel depressed.  Tell your health care provider if you have ever been abused or do not feel safe at home. This  information is not intended to replace advice given to you by your health care provider. Make sure you discuss any questions you have with your health care provider. Document Released: 12/06/2007 Document Revised: 02/06/2016 Document Reviewed: 03/13/2015 Elsevier Interactive Patient Education  [employer].  Thank you for coming in today. I hope you feel we met your needs.  Feel free to call PCP if you have any questions or further requests.  Please consider signing up for MyChart if you do not already have it, as this is a great way to communicate with me.  Best,  Whitney McVey, PA-C   IF you received an x-ray today, you will receive an invoice from [facility]. Please contact [facility] at [phone] with questions or concerns regarding your invoice.   IF you received labwork today, you will receive an invoice from [place]. Please contact [facility] at [phone] with questions or concerns regarding your invoice.   Our billing staff will not be able to assist you with questions regarding bills from these companies.  You will be contacted with the lab results as soon as they are available. The fastest way to get your results is to activate your My Chart account. Instructions are located on the last page of this paperwork. If you have not heard from [place] regarding the results in 2 weeks, please contact this office.

## 2017-11-18 LAB — CMP14+EGFR
ALT: 26 IU/L (ref 0–44)
AST: 26 IU/L (ref 0–40)
Albumin/Globulin Ratio: 1.8 (ref 1.2–2.2)
Albumin: 4.8 g/dL (ref 3.5–5.5)
Alkaline Phosphatase: 66 IU/L (ref 39–117)
BUN/Creatinine Ratio: 20 (ref 9–20)
BUN: 17 mg/dL (ref 6–24)
Bilirubin Total: 0.9 mg/dL (ref 0.0–1.2)
CO2: 21 mmol/L (ref 20–29)
Calcium: 9.4 mg/dL (ref 8.7–10.2)
Chloride: 99 mmol/L (ref 96–106)
Creatinine, Ser: 0.87 mg/dL (ref 0.76–1.27)
GFR calc Af Amer: 117 mL/min/{1.73_m2} (ref >59)
GFR calc non Af Amer: 101 mL/min/{1.73_m2} (ref >59)
Globulin, Total: 2.7 g/dL (ref 1.5–4.5)
Glucose: 155 mg/dL — ABNORMAL HIGH (ref 65–99)
Potassium: 4.1 mmol/L (ref 3.5–5.2)
Sodium: 137 mmol/L (ref 134–144)
Total Protein: 7.5 g/dL (ref 6.0–8.5)

## 2017-11-18 LAB — CBC WITH DIFFERENTIAL/PLATELET
Basophils Absolute: 0 10*3/uL (ref 0.0–0.2)
Basos: 0 %
EOS (ABSOLUTE): 0.1 10*3/uL (ref 0.0–0.4)
Eos: 2 %
Hematocrit: 42 % (ref 37.5–51.0)
Hemoglobin: 14.3 g/dL (ref 13.0–17.7)
Immature Grans (Abs): 0 10*3/uL (ref 0.0–0.1)
Immature Granulocytes: 0 %
Lymphocytes Absolute: 2.3 10*3/uL (ref 0.7–3.1)
Lymphs: 39 %
MCH: 27.5 pg (ref 26.6–33.0)
MCHC: 34 g/dL (ref 31.5–35.7)
MCV: 81 fL (ref 79–97)
Monocytes Absolute: 0.6 10*3/uL (ref 0.1–0.9)
Monocytes: 9 %
Neutrophils Absolute: 2.9 10*3/uL (ref 1.4–7.0)
Neutrophils: 50 %
Platelets: 218 10*3/uL (ref 150–450)
RBC: 5.2 x10E6/uL (ref 4.14–5.80)
RDW: 15 % (ref 12.3–15.4)
WBC: 5.9 10*3/uL (ref 3.4–10.8)

## 2017-11-18 LAB — LIPID PANEL
Chol/HDL Ratio: 4.5 ratio (ref 0.0–5.0)
Cholesterol, Total: 249 mg/dL — ABNORMAL HIGH (ref 100–199)
HDL: 55 mg/dL (ref >39)
LDL Calculated: 152 mg/dL — ABNORMAL HIGH (ref 0–99)
Triglycerides: 210 mg/dL — ABNORMAL HIGH (ref 0–149)
VLDL Cholesterol Cal: 42 mg/dL — ABNORMAL HIGH (ref 5–40)

## 2017-12-14 ENCOUNTER — Encounter: Payer: Self-pay | Admitting: Physician Assistant

## 2017-12-14 NOTE — Progress Notes (Signed)
Result letter sent in mail. Elevated lipids - advised lifestyle changes. Recheck in 1 year. Consider statin therapy if no improvement.
